# Patient Record
Sex: Female | Born: 1965 | Hispanic: Yes | Smoking: Never smoker
Health system: Southern US, Community
[De-identification: ages and names within clinical notes are randomized; demographics above are authoritative.]

## PROBLEM LIST (undated history)

## (undated) DIAGNOSIS — E119 Type 2 diabetes mellitus without complications: Secondary | ICD-10-CM

## (undated) HISTORY — PX: ABDOMINAL HYSTERECTOMY: SHX81

---

## 2004-02-17 ENCOUNTER — Other Ambulatory Visit: Payer: Self-pay

## 2007-01-17 ENCOUNTER — Emergency Department: Payer: Self-pay | Admitting: Unknown Physician Specialty

## 2007-01-17 ENCOUNTER — Other Ambulatory Visit: Payer: Self-pay

## 2008-10-09 ENCOUNTER — Emergency Department: Payer: Self-pay | Admitting: Emergency Medicine

## 2008-11-07 ENCOUNTER — Emergency Department: Payer: Self-pay | Admitting: Emergency Medicine

## 2009-06-14 IMAGING — CT CT ABD-PELV W/O CM
1 of 2 series · 15 of 32 positions shown, 19 images · non-contrast
Comparison: none

REASON FOR EXAM: (1) L flank and LLQ pain; (2) L flank and LLQ pain,
stone protocol
COMMENTS:

[Series 2: stone · axial · 0.60mm/px · z∈[-469,-37]mm · 15 of 157 slices shown, 19 images]
[im 7/157  soft-tissue]
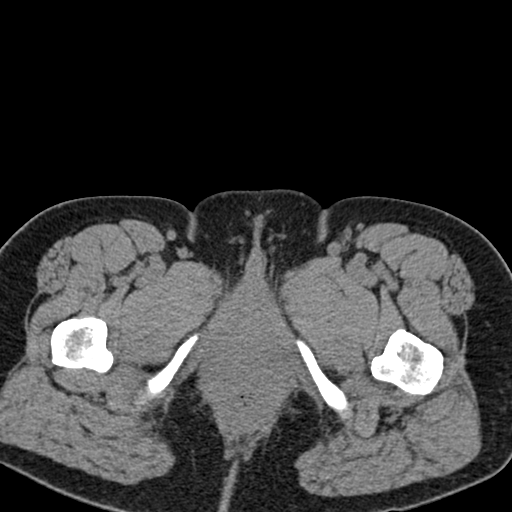
[im 7/157  bone]
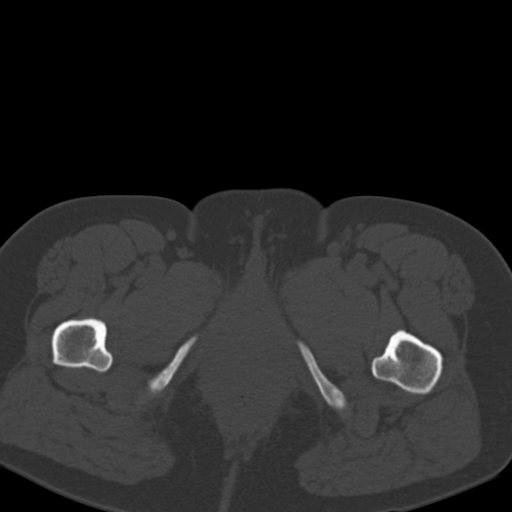
[im 19/157  soft-tissue]
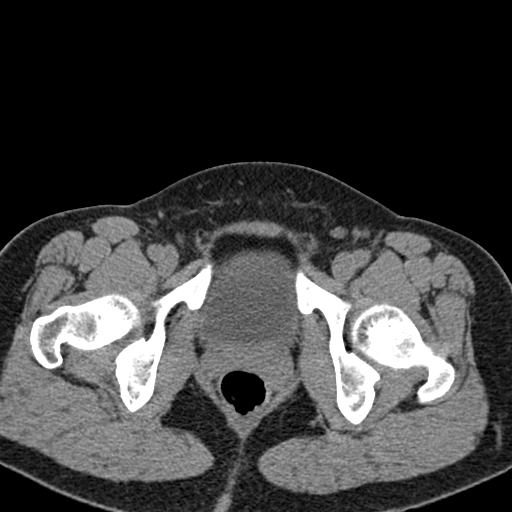
[im 31/157  soft-tissue]
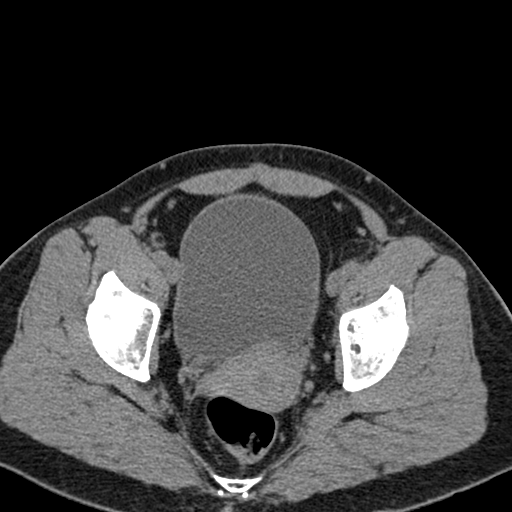
[im 43/157  soft-tissue]
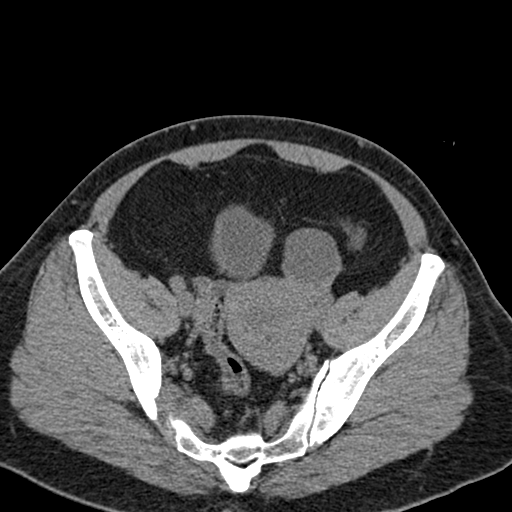
[im 55/157  soft-tissue]
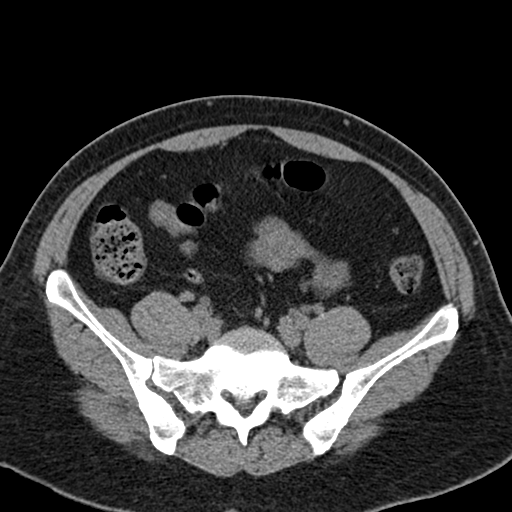
[im 67/157  soft-tissue]
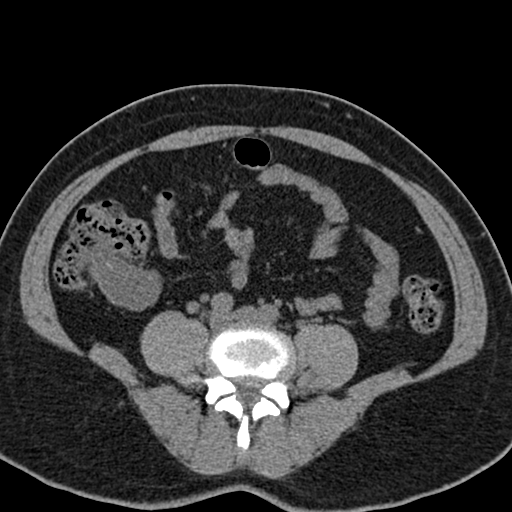
[im 79/157  soft-tissue]
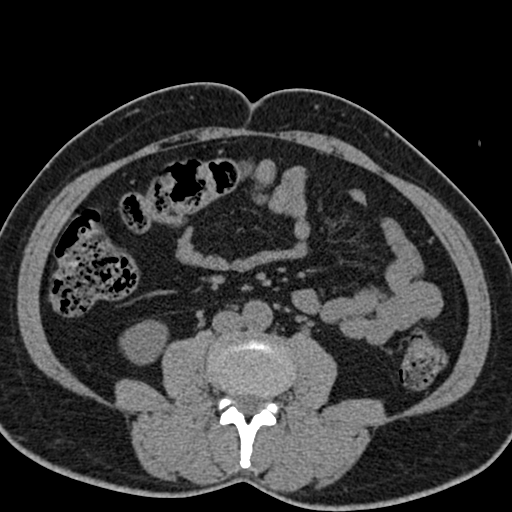
[im 91/157  soft-tissue]
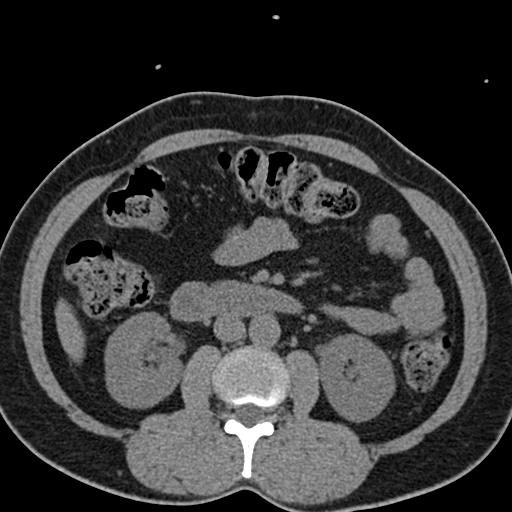
[im 103/157  soft-tissue]
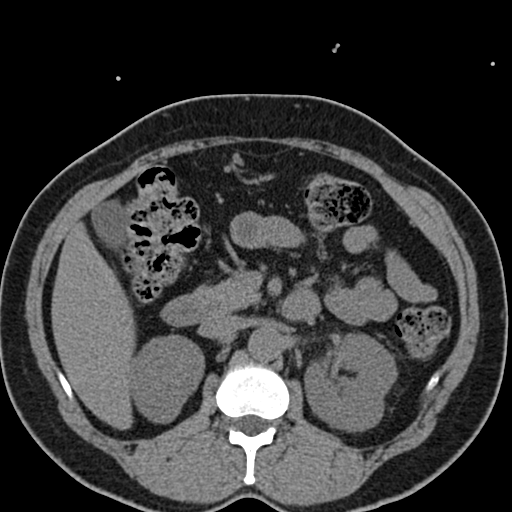
[im 103/157  bone]
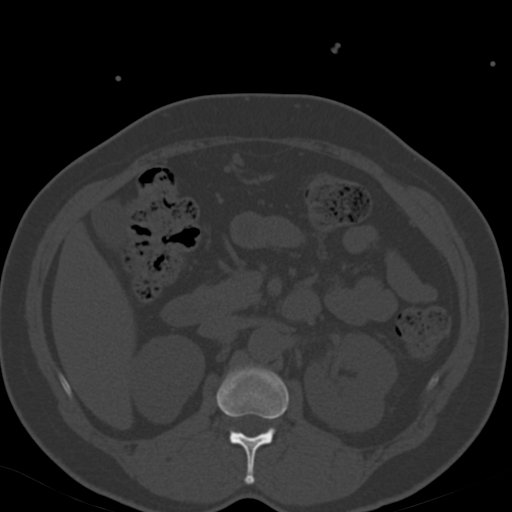
[im 115/157  soft-tissue]
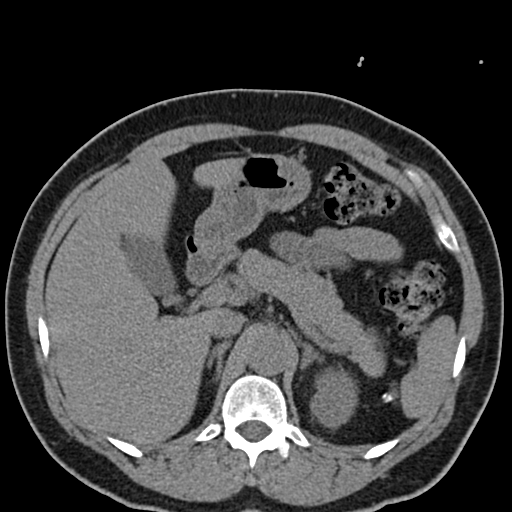
[im 127/157  soft-tissue]
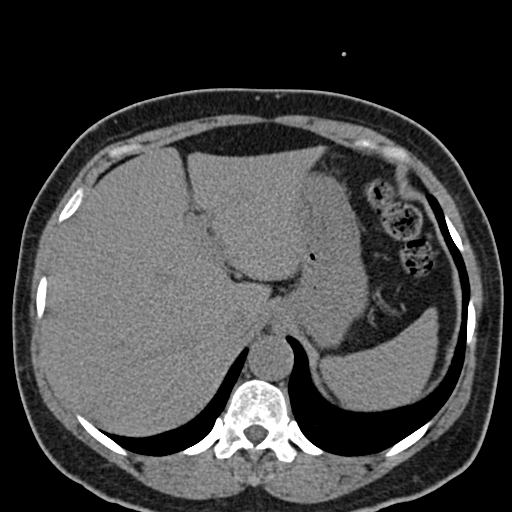
[im 133/157  lung]
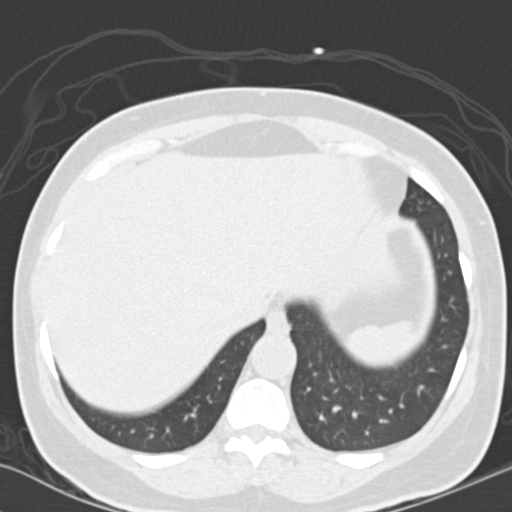
[im 139/157  soft-tissue]
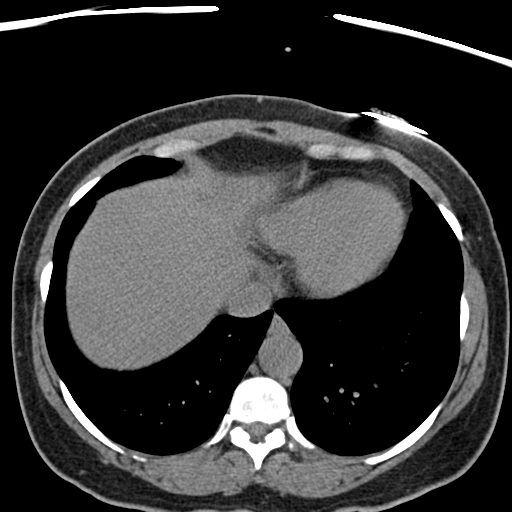
[im 139/157  lung]
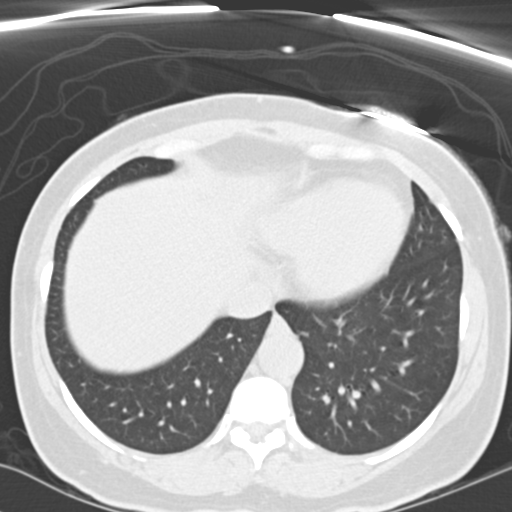
[im 145/157  lung]
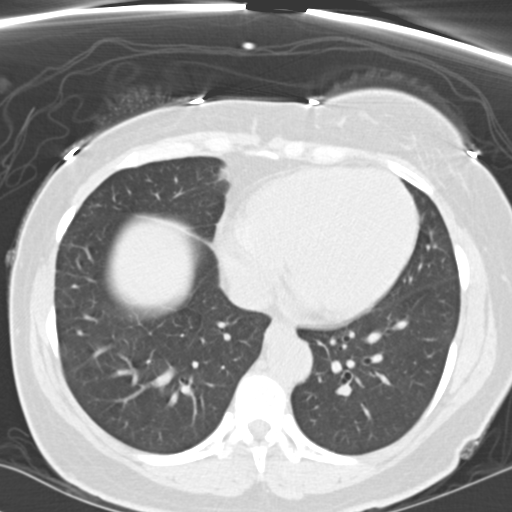
[im 151/157  soft-tissue]
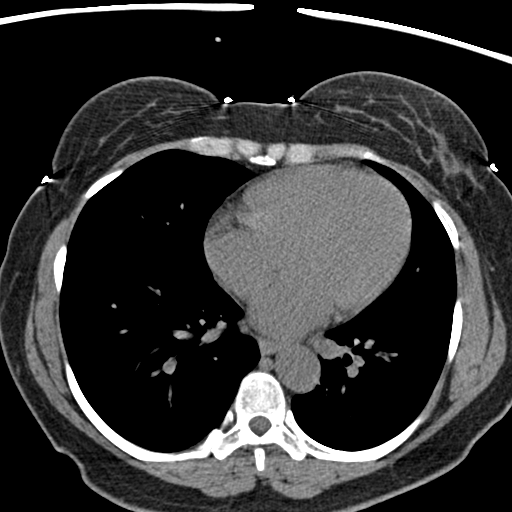
[im 151/157  lung]
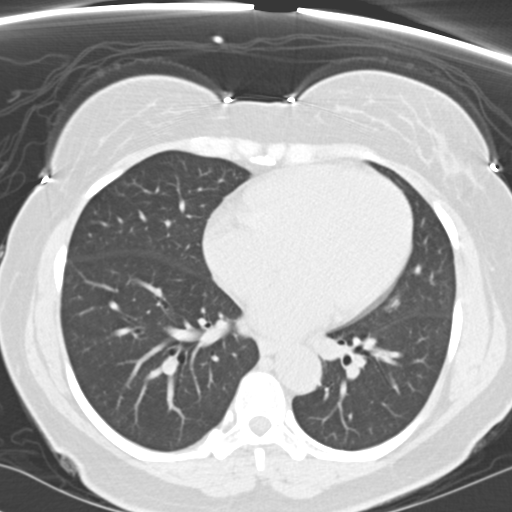

[15 of 32 positions shown; findings below may reference images not displayed]

PROCEDURE:     CT  - CT ABDOMEN AND PELVIS W[DATE]  [DATE]

RESULT:     Helical non-contrast 3 mm sections were obtained from the lung
bases through the pubic symphysis.

Evaluation of the lung bases demonstrates no gross abnormalities.

Non-contrast evaluation of the liver, spleen, adrenals and pancreas is
unremarkable. Evaluation of the RIGHT and LEFT kidneys demonstrate no
evidence of hydronephrosis, hydroureter nor ureterolithiasis. There is no
evidence of an abdominal aortic aneurysm. There is no CT evidence of bowel
obstruction, enteritis, colitis, nor diverticulitis. A low attenuating mass
projects along the LEFT anterior adnexal region measuring 3.6 cm in diameter
demonstrating the appearance of a LEFT ovarian cyst. The urinary bladder is
distended with urine. The appendix is identified and is unremarkable.
IMPRESSION: 1. No CT evidence of renal calculus disease.

2. Findings which appear to be consistent with a LEFT ovarian cyst measuring
3.6 cm in diameter.

Dr. Maryanne of the Emergency Department was informed of these findings at the
time of the initial interpretation.

## 2009-09-26 ENCOUNTER — Ambulatory Visit: Payer: Self-pay | Admitting: Nurse Practitioner

## 2016-01-18 ENCOUNTER — Ambulatory Visit: Payer: Self-pay | Attending: Oncology

## 2016-01-18 ENCOUNTER — Ambulatory Visit
Admission: RE | Admit: 2016-01-18 | Discharge: 2016-01-18 | Disposition: A | Discharge status: Home / Self Care | Payer: Self-pay | Source: Ambulatory Visit | Attending: Oncology | Admitting: Oncology

## 2016-01-18 VITALS — BP 126/74 | HR 65 | Temp 97.6°F

## 2016-01-18 DIAGNOSIS — Z0000 Encounter for general adult medical examination without abnormal findings: Secondary | ICD-10-CM

## 2016-01-18 NOTE — Progress Notes (Signed)
Subjective:     Patient ID: Dennie BibleBonifacia Mendoza-Jarquin, female   DOB: 1966/08/25, 50 y.o.   MRN: [id number]  HPI   Review of Systems     Objective:   Physical Exam  Pulmonary/Chest: Right breast exhibits no inverted nipple, no mass, no nipple discharge, no skin change and no tenderness. Left breast exhibits inverted nipple. Left breast exhibits no mass, no nipple discharge, no skin change and no tenderness. Breasts are symmetrical.         Assessment:     50 [facility]  Patient screened, and meets [facility] eligibility.  Patient does not have insurance, Medicare or Medicaid.  Handout given on Affordable Care Act.  Instructed patient on breast self-exam, and annual mammograms using teach back method.  CBE unremarkable.  Patient reports inverted left nipple results from abscess excision 38 years ago .  Delos HaringLoyda Murr interpreted exam.    Plan:     Sent for bilateral screening mammogram.

## 2016-01-20 NOTE — Progress Notes (Signed)
Letter mailed from [facility] to notify of normal mammogram results.  Patient to return in one year for annual screening.  Copy to [facility]. 

## 2016-08-20 ENCOUNTER — Emergency Department
Admission: EM | Admit: 2016-08-20 | Discharge: 2016-08-20 | Disposition: A | Discharge status: Home / Self Care | Payer: No Typology Code available for payment source | Attending: Emergency Medicine | Admitting: Emergency Medicine

## 2016-08-20 ENCOUNTER — Encounter: Payer: Self-pay | Admitting: Emergency Medicine

## 2016-08-20 DIAGNOSIS — E119 Type 2 diabetes mellitus without complications: Secondary | ICD-10-CM | POA: Insufficient documentation

## 2016-08-20 DIAGNOSIS — Y9389 Activity, other specified: Secondary | ICD-10-CM | POA: Insufficient documentation

## 2016-08-20 DIAGNOSIS — Y999 Unspecified external cause status: Secondary | ICD-10-CM | POA: Diagnosis not present

## 2016-08-20 DIAGNOSIS — S39012A Strain of muscle, fascia and tendon of lower back, initial encounter: Secondary | ICD-10-CM | POA: Insufficient documentation

## 2016-08-20 DIAGNOSIS — S3992XA Unspecified injury of lower back, initial encounter: Secondary | ICD-10-CM | POA: Diagnosis present

## 2016-08-20 DIAGNOSIS — Y92410 Unspecified street and highway as the place of occurrence of the external cause: Secondary | ICD-10-CM | POA: Insufficient documentation

## 2016-08-20 HISTORY — DX: Type 2 diabetes mellitus without complications: E11.9

## 2016-08-20 MED ORDER — MELOXICAM 15 MG PO TABS: 15.0000 mg | ORAL_TABLET | Freq: Every day | ORAL | 0 refills | Status: AC

## 2016-08-20 MED ORDER — CYCLOBENZAPRINE HCL 10 MG PO TABS: 10.0000 mg | ORAL_TABLET | Freq: Three times a day (TID) | ORAL | 0 refills | Status: AC | PRN

## 2016-08-20 NOTE — ED Notes (Signed)
Discharge instructions reviewed with patient. Patient verbalized understanding. Patient ambulated to lobby without difficulty.   
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
